# Patient Record
Sex: Female | Born: 1975 | Race: Black or African American | Hispanic: No | Marital: Single | Smoking: Never smoker
Health system: Southern US, Community
[De-identification: ages and names within clinical notes are randomized; demographics above are authoritative.]

---

## 2003-04-08 ENCOUNTER — Other Ambulatory Visit: Admission: RE | Admit: 2003-04-08 | Discharge: 2003-04-08 | Payer: Self-pay | Admitting: Family Medicine

## 2004-04-28 ENCOUNTER — Other Ambulatory Visit: Admission: RE | Admit: 2004-04-28 | Discharge: 2004-04-28 | Payer: Self-pay | Admitting: Family Medicine

## 2005-04-29 ENCOUNTER — Other Ambulatory Visit: Admission: RE | Admit: 2005-04-29 | Discharge: 2005-04-29 | Payer: Self-pay | Admitting: Family Medicine

## 2006-05-04 ENCOUNTER — Other Ambulatory Visit: Admission: RE | Admit: 2006-05-04 | Discharge: 2006-05-04 | Payer: Self-pay | Admitting: Obstetrics and Gynecology

## 2006-08-31 ENCOUNTER — Other Ambulatory Visit: Admission: RE | Admit: 2006-08-31 | Discharge: 2006-08-31 | Payer: Self-pay | Admitting: Obstetrics and Gynecology

## 2007-03-20 ENCOUNTER — Other Ambulatory Visit: Admission: RE | Admit: 2007-03-20 | Discharge: 2007-03-20 | Payer: Self-pay | Admitting: Obstetrics and Gynecology

## 2007-09-11 ENCOUNTER — Other Ambulatory Visit: Admission: RE | Admit: 2007-09-11 | Discharge: 2007-09-11 | Payer: Self-pay | Admitting: Obstetrics and Gynecology

## 2008-04-01 ENCOUNTER — Inpatient Hospital Stay (HOSPITAL_COMMUNITY): Admission: AD | Admit: 2008-04-01 | Discharge: 2008-04-04 | Payer: Self-pay | Admitting: Obstetrics and Gynecology

## 2008-09-25 ENCOUNTER — Other Ambulatory Visit: Admission: RE | Admit: 2008-09-25 | Discharge: 2008-09-25 | Payer: Self-pay | Admitting: Obstetrics and Gynecology

## 2009-10-01 ENCOUNTER — Other Ambulatory Visit: Admission: RE | Admit: 2009-10-01 | Discharge: 2009-10-01 | Payer: Self-pay | Admitting: Obstetrics and Gynecology

## 2010-03-29 ENCOUNTER — Encounter: Payer: Self-pay | Admitting: Obstetrics and Gynecology

## 2010-06-21 LAB — DIFFERENTIAL
Basophils Absolute: 0.1 10*3/uL (ref 0.0–0.1)
Basophils Relative: 1 % (ref 0–1)
Eosinophils Absolute: 0.1 10*3/uL (ref 0.0–0.7)
Eosinophils Relative: 1 % (ref 0–5)
Neutrophils Relative %: 66 % (ref 43–77)

## 2010-06-21 LAB — CBC
HCT: 24.4 % — ABNORMAL LOW (ref 36.0–46.0)
HCT: 35.9 % — ABNORMAL LOW (ref 36.0–46.0)
Hemoglobin: 11.3 g/dL — ABNORMAL LOW (ref 12.0–15.0)
Hemoglobin: 7.9 g/dL — CL (ref 12.0–15.0)
MCHC: 32.1 g/dL (ref 30.0–36.0)
MCHC: 32.3 g/dL (ref 30.0–36.0)
MCV: 79.5 fL (ref 78.0–100.0)
MCV: 79.7 fL (ref 78.0–100.0)
Platelets: 142 10*3/uL — ABNORMAL LOW (ref 150–400)
RBC: 3.06 MIL/uL — ABNORMAL LOW (ref 3.87–5.11)
RBC: 4.51 MIL/uL (ref 3.87–5.11)
RDW: 14.9 % (ref 11.5–15.5)
RDW: 14.9 % (ref 11.5–15.5)
WBC: 14.1 10*3/uL — ABNORMAL HIGH (ref 4.0–10.5)

## 2010-10-21 ENCOUNTER — Other Ambulatory Visit (HOSPITAL_COMMUNITY)
Admission: RE | Admit: 2010-10-21 | Discharge: 2010-10-21 | Disposition: A | Discharge status: Home / Self Care | Payer: PRIVATE HEALTH INSURANCE | Source: Ambulatory Visit | Attending: Obstetrics and Gynecology | Admitting: Obstetrics and Gynecology

## 2010-10-21 ENCOUNTER — Other Ambulatory Visit: Payer: Self-pay | Admitting: Nurse Practitioner

## 2010-10-21 DIAGNOSIS — Z01419 Encounter for gynecological examination (general) (routine) without abnormal findings: Secondary | ICD-10-CM | POA: Insufficient documentation

## 2011-11-01 ENCOUNTER — Other Ambulatory Visit: Payer: Self-pay | Admitting: Nurse Practitioner

## 2011-11-01 ENCOUNTER — Other Ambulatory Visit (HOSPITAL_COMMUNITY)
Admission: RE | Admit: 2011-11-01 | Discharge: 2011-11-01 | Disposition: A | Discharge status: Home / Self Care | Payer: BC Managed Care – PPO | Source: Ambulatory Visit | Attending: Obstetrics and Gynecology | Admitting: Obstetrics and Gynecology

## 2011-11-01 DIAGNOSIS — Z01419 Encounter for gynecological examination (general) (routine) without abnormal findings: Secondary | ICD-10-CM | POA: Insufficient documentation

## 2014-04-29 ENCOUNTER — Other Ambulatory Visit: Payer: Self-pay

## 2014-04-29 DIAGNOSIS — Z1231 Encounter for screening mammogram for malignant neoplasm of breast: Secondary | ICD-10-CM

## 2014-05-12 ENCOUNTER — Ambulatory Visit
Admission: RE | Admit: 2014-05-12 | Discharge: 2014-05-12 | Disposition: A | Discharge status: Home / Self Care | Payer: BLUE CROSS/BLUE SHIELD | Source: Ambulatory Visit

## 2014-05-12 DIAGNOSIS — Z1231 Encounter for screening mammogram for malignant neoplasm of breast: Secondary | ICD-10-CM

## 2014-11-11 ENCOUNTER — Other Ambulatory Visit (HOSPITAL_COMMUNITY)
Admission: RE | Admit: 2014-11-11 | Discharge: 2014-11-11 | Disposition: A | Discharge status: Home / Self Care | Payer: BLUE CROSS/BLUE SHIELD | Source: Ambulatory Visit | Attending: Nurse Practitioner | Admitting: Nurse Practitioner

## 2014-11-11 ENCOUNTER — Other Ambulatory Visit: Payer: Self-pay | Admitting: Nurse Practitioner

## 2014-11-11 DIAGNOSIS — Z113 Encounter for screening for infections with a predominantly sexual mode of transmission: Secondary | ICD-10-CM | POA: Insufficient documentation

## 2014-11-11 DIAGNOSIS — Z1151 Encounter for screening for human papillomavirus (HPV): Secondary | ICD-10-CM | POA: Insufficient documentation

## 2014-11-11 DIAGNOSIS — Z01419 Encounter for gynecological examination (general) (routine) without abnormal findings: Secondary | ICD-10-CM | POA: Diagnosis present

## 2014-11-13 LAB — CYTOLOGY - PAP

## 2015-04-21 ENCOUNTER — Other Ambulatory Visit: Payer: Self-pay

## 2015-04-21 DIAGNOSIS — Z1231 Encounter for screening mammogram for malignant neoplasm of breast: Secondary | ICD-10-CM

## 2015-05-18 ENCOUNTER — Ambulatory Visit
Admission: RE | Admit: 2015-05-18 | Discharge: 2015-05-18 | Disposition: A | Discharge status: Home / Self Care | Payer: BLUE CROSS/BLUE SHIELD | Source: Ambulatory Visit

## 2015-05-18 DIAGNOSIS — Z1231 Encounter for screening mammogram for malignant neoplasm of breast: Secondary | ICD-10-CM

## 2016-04-22 ENCOUNTER — Other Ambulatory Visit: Payer: Self-pay | Admitting: Nurse Practitioner

## 2016-04-22 DIAGNOSIS — Z1231 Encounter for screening mammogram for malignant neoplasm of breast: Secondary | ICD-10-CM

## 2016-05-23 ENCOUNTER — Ambulatory Visit
Admission: RE | Admit: 2016-05-23 | Discharge: 2016-05-23 | Disposition: A | Discharge status: Home / Self Care | Payer: No Typology Code available for payment source | Source: Ambulatory Visit | Attending: Nurse Practitioner | Admitting: Nurse Practitioner

## 2016-05-23 DIAGNOSIS — Z1231 Encounter for screening mammogram for malignant neoplasm of breast: Secondary | ICD-10-CM

## 2016-05-24 ENCOUNTER — Other Ambulatory Visit: Payer: Self-pay | Admitting: Nurse Practitioner

## 2016-05-24 DIAGNOSIS — R928 Other abnormal and inconclusive findings on diagnostic imaging of breast: Secondary | ICD-10-CM

## 2016-05-27 ENCOUNTER — Ambulatory Visit
Admission: RE | Admit: 2016-05-27 | Discharge: 2016-05-27 | Disposition: A | Discharge status: Home / Self Care | Payer: No Typology Code available for payment source | Source: Ambulatory Visit | Attending: Nurse Practitioner | Admitting: Nurse Practitioner

## 2016-05-27 DIAGNOSIS — R928 Other abnormal and inconclusive findings on diagnostic imaging of breast: Secondary | ICD-10-CM

## 2017-04-08 ENCOUNTER — Other Ambulatory Visit: Payer: Self-pay

## 2017-04-08 ENCOUNTER — Encounter (HOSPITAL_BASED_OUTPATIENT_CLINIC_OR_DEPARTMENT_OTHER): Payer: Self-pay | Admitting: Emergency Medicine

## 2017-04-08 ENCOUNTER — Emergency Department (HOSPITAL_BASED_OUTPATIENT_CLINIC_OR_DEPARTMENT_OTHER)
Admission: EM | Admit: 2017-04-08 | Discharge: 2017-04-08 | Disposition: A | Discharge status: Home / Self Care | Payer: No Typology Code available for payment source | Attending: Emergency Medicine | Admitting: Emergency Medicine

## 2017-04-08 DIAGNOSIS — R109 Unspecified abdominal pain: Secondary | ICD-10-CM | POA: Diagnosis present

## 2017-04-08 DIAGNOSIS — N946 Dysmenorrhea, unspecified: Secondary | ICD-10-CM | POA: Insufficient documentation

## 2017-04-08 LAB — URINALYSIS, ROUTINE W REFLEX MICROSCOPIC
BILIRUBIN URINE: NEGATIVE
Glucose, UA: NEGATIVE mg/dL
KETONES UR: 15 mg/dL — AB
NITRITE: NEGATIVE
PROTEIN: NEGATIVE mg/dL
Specific Gravity, Urine: >1.03 — ABNORMAL HIGH (ref 1.005–1.030)
pH: 6 (ref 5.0–8.0)

## 2017-04-08 LAB — WET PREP, GENITAL
Clue Cells Wet Prep HPF POC: NONE SEEN
Sperm: NONE SEEN
Trich, Wet Prep: NONE SEEN
Yeast Wet Prep HPF POC: NONE SEEN

## 2017-04-08 LAB — PREGNANCY, URINE: PREG TEST UR: NEGATIVE

## 2017-04-08 LAB — URINALYSIS, MICROSCOPIC (REFLEX)

## 2017-04-08 MED ORDER — IBUPROFEN 800 MG PO TABS: 800.0000 mg | ORAL_TABLET | Freq: Three times a day (TID) | ORAL | 0 refills | Status: AC | PRN

## 2017-04-08 MED ORDER — ONDANSETRON 8 MG PO TBDP
8.0000 mg | ORAL_TABLET | Freq: Once | ORAL | Status: AC
  Administered 2017-04-08: 8 mg via ORAL
  Filled 2017-04-08: qty 1

## 2017-04-08 NOTE — ED Triage Notes (Signed)
Pt presents with c/o lower abdominal pain and nausea. Pt took 600mg  motrin at 345.

## 2017-04-08 NOTE — ED Notes (Signed)
ED Provider at bedside. 

## 2017-04-08 NOTE — ED Provider Notes (Signed)
[facility] Provider Note: Lowella Dell, MD, FACEP  CSN: [id number] MRN: [id number] ARRIVAL: 04/08/17 at 0459 ROOM: MH10/MH10   CHIEF COMPLAINT  Abdominal Pain   HISTORY OF PRESENT ILLNESS  04/08/17 5:12 AM Brenda Booth is a 42 y.o. female recently changed her birth control pill scheduling to avoid having a period this week.  Despite this she had some vaginal spotting 3 days ago and the next morning had fairly significant vaginal bleeding which then subsided.  She has had no more bleeding but is now complaining of several days of cramping.  The cramping is located in the uterus and is like menstrual cramps but more severe.  She rates the pain as a 10 out of 10 at its worst.  She took 600 mg of ibuprofen yesterday afternoon and again about 90 minutes ago.  Her pain right now is mild.  She has had nausea but no vomiting.   History reviewed. No pertinent past medical history.  History reviewed. No pertinent surgical history.  No family history on file.  Social History   Tobacco Use  . Smoking status: Never Smoker  . Smokeless tobacco: Never Used  Substance Use Topics  . Alcohol use: No    Frequency: Never  . Drug use: No    Prior to Admission medications   Not on File    Allergies Patient has no known allergies.   REVIEW OF SYSTEMS  Negative except as noted here or in the History of Present Illness.   PHYSICAL EXAMINATION  Initial Vital Signs Blood pressure (!) 150/107, pulse 88, temperature 98.6 F (37 C), temperature source Oral, resp. rate 17, height 5' 5.5" (1.664 m), weight 77.1 kg (170 lb), last menstrual period 03/11/2017, SpO2 98 %.  Examination General: Well-developed, well-nourished female in no acute distress; appearance consistent with age of record HENT: normocephalic; atraumatic Eyes: pupils equal, round and reactive to light; extraocular muscles intact Neck: supple Heart: regular rate and rhythm Lungs: clear to auscultation  bilaterally Abdomen: soft; nondistended; nontender; no masses or hepatosplenomegaly; bowel sounds present GU: Normal external genitalia; small amount of bleeding and mucoid/purulent discharge per cervical loss; no cervical motion tenderness; mild left adnexal tenderness Extremities: No deformity; full range of motion; pulses normal Neurologic: Awake, alert and oriented; motor function intact in all extremities and symmetric; no facial droop Skin: Warm and dry Psychiatric: Normal mood and affect   RESULTS  Summary of this visit's results, reviewed by myself:   EKG Interpretation  Date/Time:    Ventricular Rate:    PR Interval:    QRS Duration:   QT Interval:    QTC Calculation:   R Axis:     Text Interpretation:        Laboratory Studies: Results for orders placed or performed during the hospital encounter of 04/08/17 (from the past 24 hour(s))  Urinalysis, Routine w reflex microscopic     Status: Abnormal   Collection Time: 04/08/17  5:12 AM  Result Value Ref Range   Color, Urine YELLOW YELLOW   APPearance CLOUDY (A) CLEAR   Specific Gravity, Urine >1.030 (H) 1.005 - 1.030   pH 6.0 5.0 - 8.0   Glucose, UA NEGATIVE NEGATIVE mg/dL   Hgb urine dipstick LARGE (A) NEGATIVE   Bilirubin Urine NEGATIVE NEGATIVE   Ketones, ur 15 (A) NEGATIVE mg/dL   Protein, ur NEGATIVE NEGATIVE mg/dL   Nitrite NEGATIVE NEGATIVE   Leukocytes, UA TRACE (A) NEGATIVE  Pregnancy, urine     Status: None  Collection Time: 04/08/17  5:12 AM  Result Value Ref Range   Preg Test, Ur NEGATIVE NEGATIVE  Urinalysis, Microscopic (reflex)     Status: Abnormal   Collection Time: 04/08/17  5:12 AM  Result Value Ref Range   RBC / HPF TOO NUMEROUS TO COUNT 0 - 5 RBC/hpf   WBC, UA 6-30 0 - 5 WBC/hpf   Bacteria, UA MANY (A) NONE SEEN   Squamous Epithelial / LPF 0-5 (A) NONE SEEN   Mucus PRESENT   Wet prep, genital     Status: Abnormal   Collection Time: 04/08/17  5:30 AM  Result Value Ref Range   Yeast Wet  Prep HPF POC NONE SEEN NONE SEEN   Trich, Wet Prep NONE SEEN NONE SEEN   Clue Cells Wet Prep HPF POC NONE SEEN NONE SEEN   WBC, Wet Prep HPF POC MANY (A) NONE SEEN   Sperm NONE SEEN    Imaging Studies: No results found.  ED COURSE  Nursing notes and initial vitals signs, including pulse oximetry, reviewed.  Vitals:   04/08/17 0505 04/08/17 0506  BP: (!) 150/107   Pulse: 88   Resp: 17   Temp: 98.6 F (37 C)   TempSrc: Oral   SpO2: 98%   Weight:  77.1 kg (170 lb)  Height:  5' 5.5" (1.664 m)   Patient's symptoms are likely dysmenorrhea associated with rescheduling her birth control.  I doubt PID or cervicitis as patient's exam was benign.  GC/Chlamydia swab has been sent.  PROCEDURES    ED DIAGNOSES     ICD-10-CM   1. Dysmenorrhea N94.6        Barrett Goldie, Jonny RuizJohn, MD 04/08/17 [id number]

## 2017-04-10 LAB — GC/CHLAMYDIA PROBE AMP (~~LOC~~) NOT AT ARMC
CHLAMYDIA, DNA PROBE: NEGATIVE
NEISSERIA GONORRHEA: NEGATIVE

## 2017-06-28 ENCOUNTER — Other Ambulatory Visit: Payer: Self-pay | Admitting: Nurse Practitioner

## 2017-06-28 DIAGNOSIS — Z1231 Encounter for screening mammogram for malignant neoplasm of breast: Secondary | ICD-10-CM

## 2017-07-24 ENCOUNTER — Ambulatory Visit
Admission: RE | Admit: 2017-07-24 | Discharge: 2017-07-24 | Disposition: A | Discharge status: Home / Self Care | Payer: PRIVATE HEALTH INSURANCE | Source: Ambulatory Visit | Attending: Nurse Practitioner | Admitting: Nurse Practitioner

## 2017-07-24 DIAGNOSIS — Z1231 Encounter for screening mammogram for malignant neoplasm of breast: Secondary | ICD-10-CM

## 2017-11-21 ENCOUNTER — Other Ambulatory Visit: Payer: Self-pay | Admitting: Nurse Practitioner

## 2017-11-21 ENCOUNTER — Other Ambulatory Visit (HOSPITAL_COMMUNITY)
Admission: RE | Admit: 2017-11-21 | Discharge: 2017-11-21 | Disposition: A | Discharge status: Home / Self Care | Payer: PRIVATE HEALTH INSURANCE | Source: Ambulatory Visit | Attending: Nurse Practitioner | Admitting: Nurse Practitioner

## 2017-11-21 DIAGNOSIS — Z01419 Encounter for gynecological examination (general) (routine) without abnormal findings: Secondary | ICD-10-CM | POA: Diagnosis present

## 2017-11-23 LAB — CYTOLOGY - PAP
Chlamydia: NEGATIVE
Diagnosis: NEGATIVE
HPV: NOT DETECTED
Neisseria Gonorrhea: NEGATIVE

## 2018-06-12 ENCOUNTER — Other Ambulatory Visit: Payer: Self-pay | Admitting: Nurse Practitioner

## 2018-06-12 DIAGNOSIS — N644 Mastodynia: Secondary | ICD-10-CM

## 2018-06-15 ENCOUNTER — Other Ambulatory Visit: Payer: No Typology Code available for payment source

## 2018-06-26 ENCOUNTER — Other Ambulatory Visit: Payer: Self-pay | Admitting: Nurse Practitioner

## 2018-06-26 DIAGNOSIS — Z1231 Encounter for screening mammogram for malignant neoplasm of breast: Secondary | ICD-10-CM

## 2018-07-06 ENCOUNTER — Other Ambulatory Visit: Payer: No Typology Code available for payment source

## 2018-09-03 ENCOUNTER — Ambulatory Visit
Admission: RE | Admit: 2018-09-03 | Discharge: 2018-09-03 | Disposition: A | Discharge status: Home / Self Care | Payer: PRIVATE HEALTH INSURANCE | Source: Ambulatory Visit | Attending: Nurse Practitioner | Admitting: Nurse Practitioner

## 2018-09-03 ENCOUNTER — Other Ambulatory Visit: Payer: Self-pay

## 2018-09-03 DIAGNOSIS — Z1231 Encounter for screening mammogram for malignant neoplasm of breast: Secondary | ICD-10-CM

## 2018-09-04 ENCOUNTER — Other Ambulatory Visit: Payer: Self-pay | Admitting: Nurse Practitioner

## 2018-09-04 DIAGNOSIS — R928 Other abnormal and inconclusive findings on diagnostic imaging of breast: Secondary | ICD-10-CM

## 2018-09-10 ENCOUNTER — Other Ambulatory Visit: Payer: PRIVATE HEALTH INSURANCE

## 2018-09-12 ENCOUNTER — Other Ambulatory Visit: Payer: Self-pay | Admitting: Nurse Practitioner

## 2018-09-12 ENCOUNTER — Ambulatory Visit
Admission: RE | Admit: 2018-09-12 | Discharge: 2018-09-12 | Disposition: A | Discharge status: Home / Self Care | Payer: PRIVATE HEALTH INSURANCE | Source: Ambulatory Visit | Attending: Nurse Practitioner | Admitting: Nurse Practitioner

## 2018-09-12 ENCOUNTER — Other Ambulatory Visit: Payer: Self-pay

## 2018-09-12 DIAGNOSIS — R928 Other abnormal and inconclusive findings on diagnostic imaging of breast: Secondary | ICD-10-CM

## 2018-09-12 DIAGNOSIS — N6320 Unspecified lump in the left breast, unspecified quadrant: Secondary | ICD-10-CM

## 2019-04-02 ENCOUNTER — Ambulatory Visit
Admission: RE | Admit: 2019-04-02 | Discharge: 2019-04-02 | Disposition: A | Discharge status: Home / Self Care | Payer: PRIVATE HEALTH INSURANCE | Source: Ambulatory Visit | Attending: Nurse Practitioner | Admitting: Nurse Practitioner

## 2019-04-02 ENCOUNTER — Other Ambulatory Visit: Payer: Self-pay | Admitting: Nurse Practitioner

## 2019-04-02 ENCOUNTER — Other Ambulatory Visit: Payer: Self-pay

## 2019-04-02 DIAGNOSIS — N6320 Unspecified lump in the left breast, unspecified quadrant: Secondary | ICD-10-CM

## 2019-09-22 IMAGING — MG DIGITAL SCREENING BILATERAL MAMMOGRAM WITH CAD
4 series · 4 of 4 positions shown · non-contrast
Comparison: Previous exam(s).

CLINICAL DATA: Screening.

EXAM:
DIGITAL SCREENING BILATERAL MAMMOGRAM WITH CAD

[R MLO]
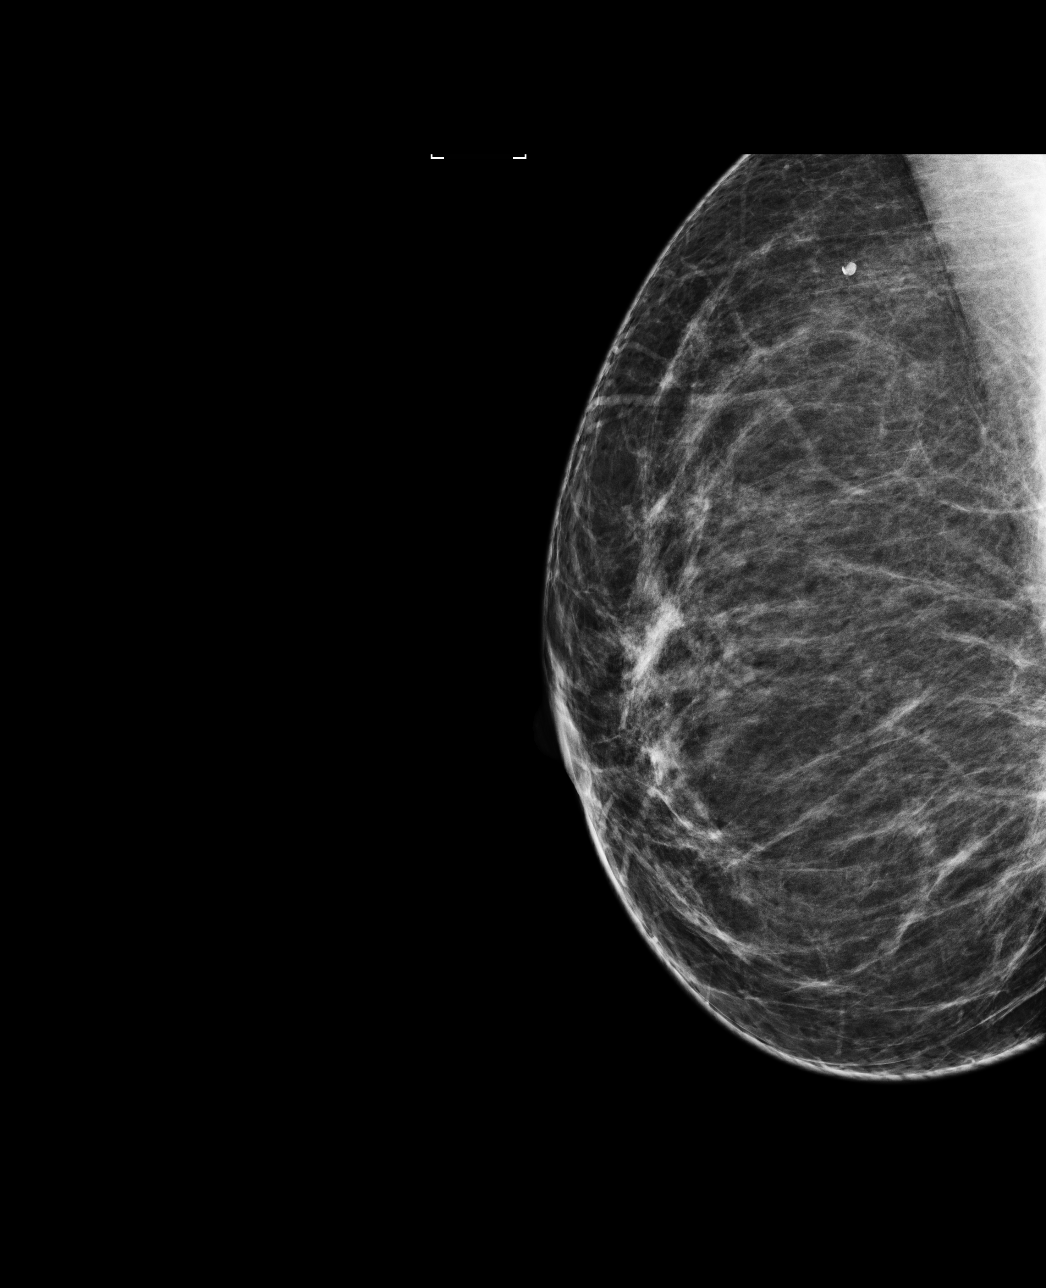

[R CC]
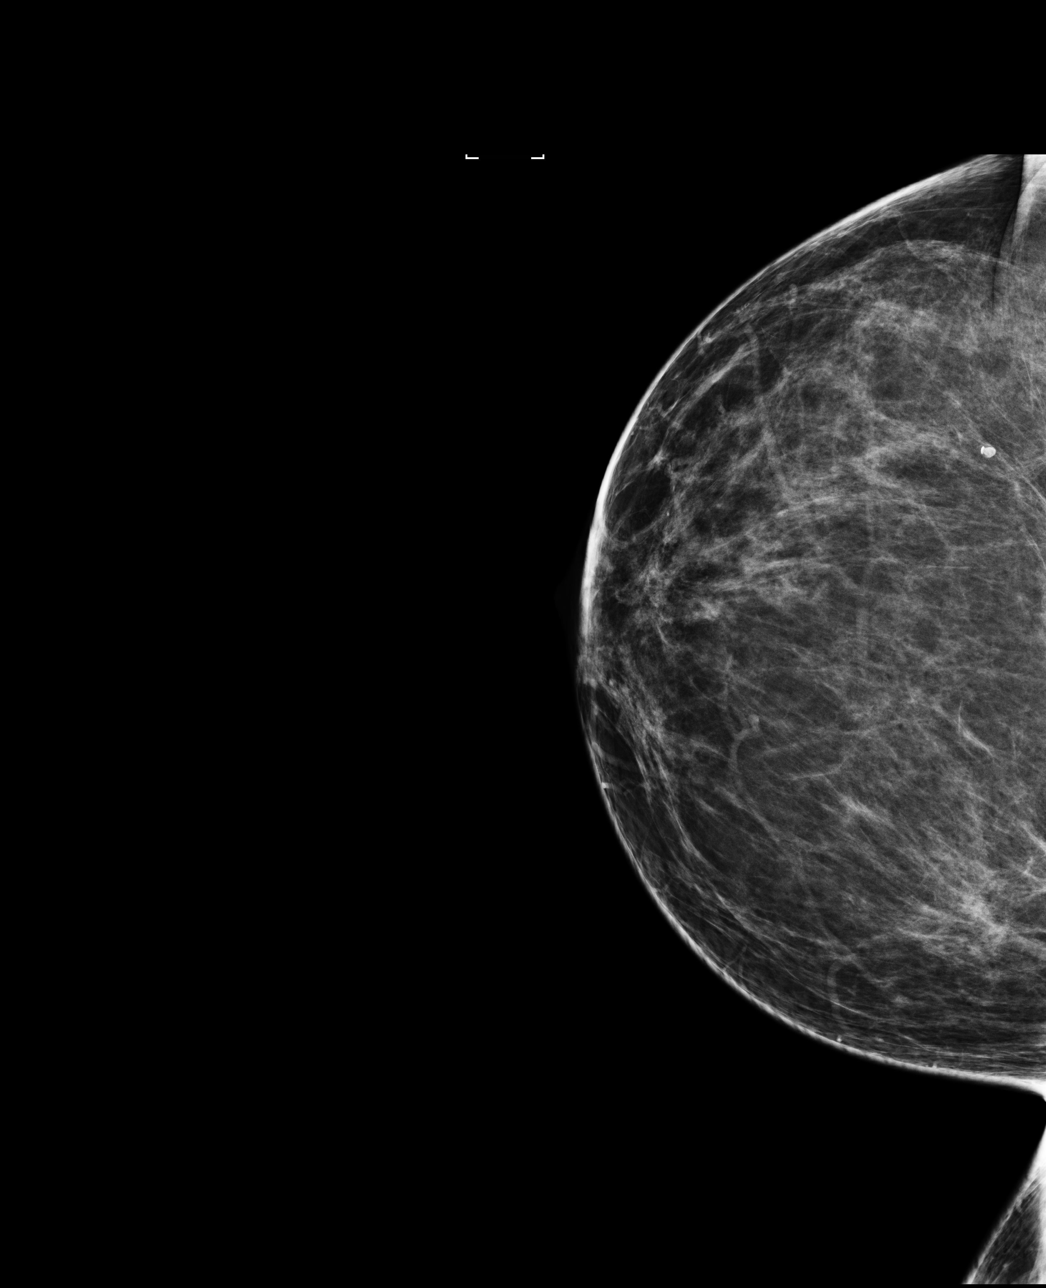

[L CC]
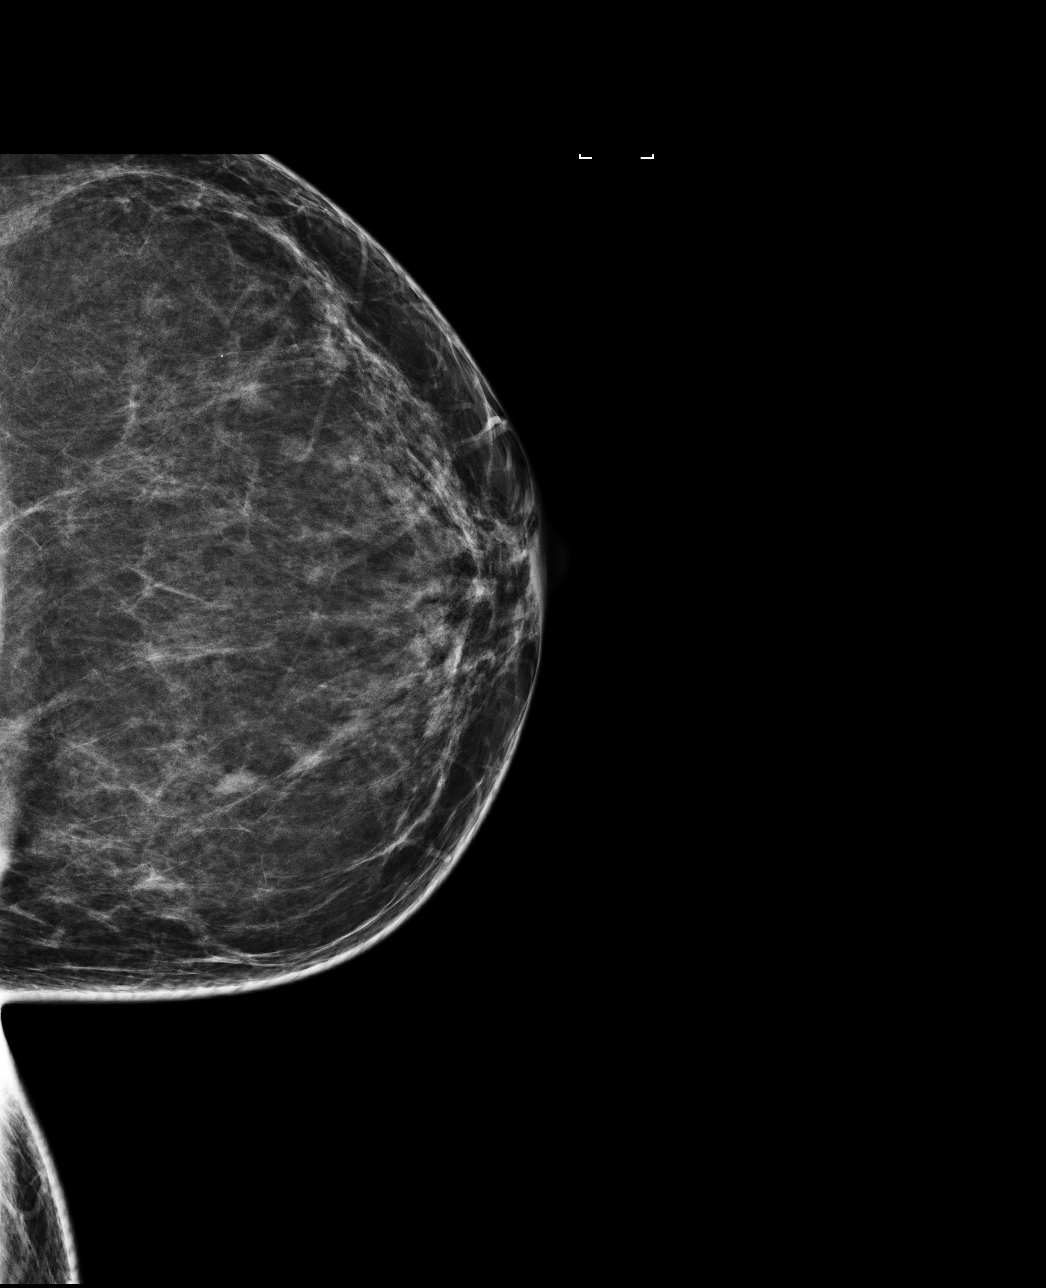

[L MLO]
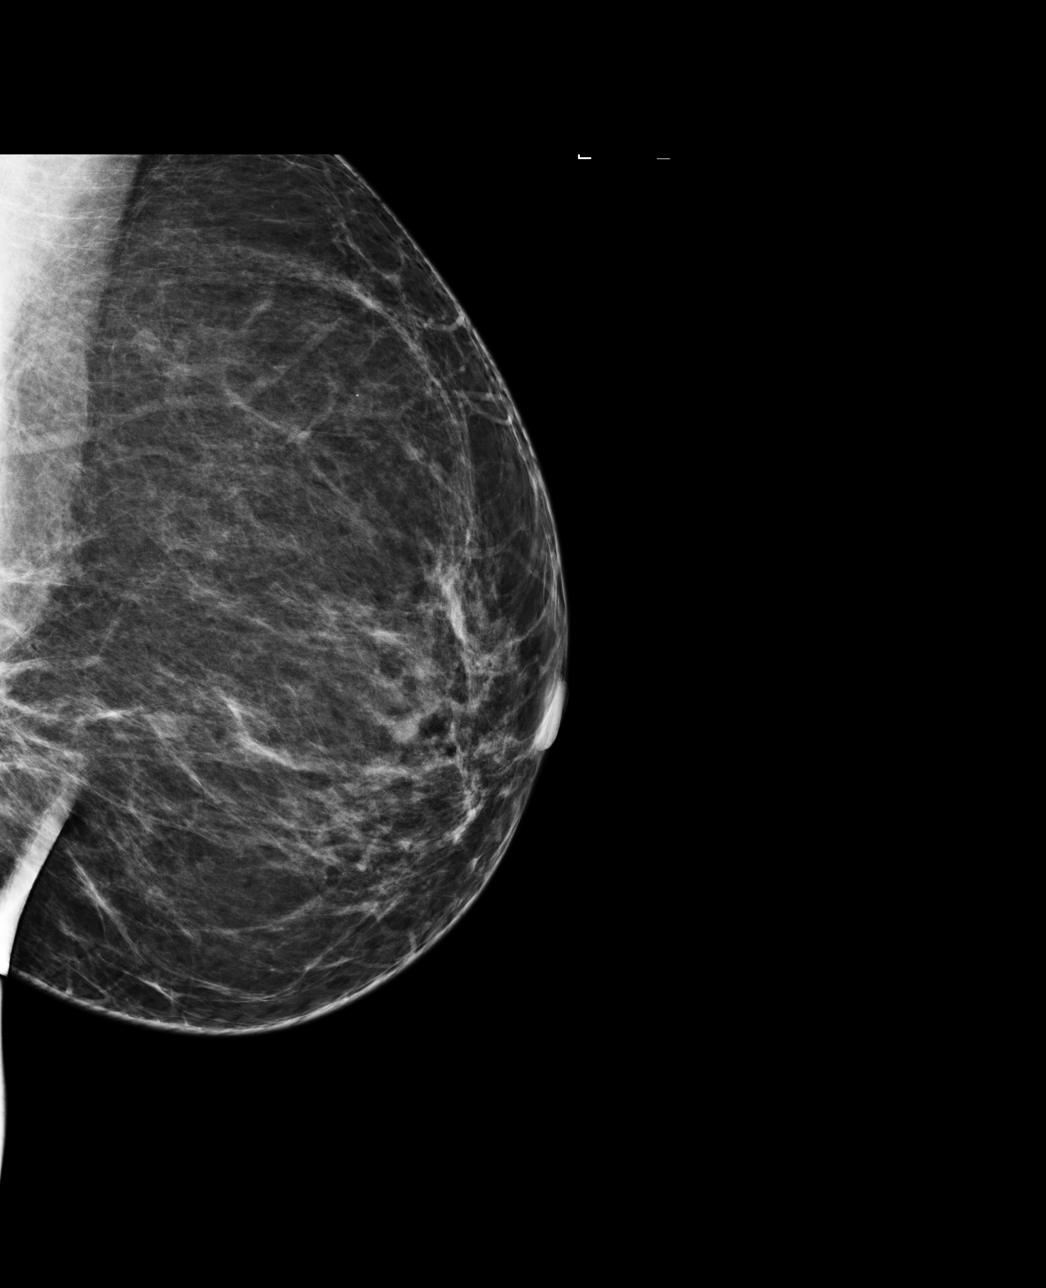

[4 of 4 positions shown; findings below may reference images not displayed]

ACR Breast Density Category b: There are scattered areas of
fibroglandular density.
FINDINGS: There are no findings suspicious for malignancy. Images were
processed with CAD.
IMPRESSION: No mammographic evidence of malignancy. A result letter of this
screening mammogram will be mailed directly to the patient.

RECOMMENDATION:
Screening mammogram in one year. (Code:AS-G-LCT)

BI-RADS CATEGORY  1: Negative.

## 2019-10-04 ENCOUNTER — Other Ambulatory Visit: Payer: Self-pay

## 2019-10-04 ENCOUNTER — Ambulatory Visit
Admission: RE | Admit: 2019-10-04 | Discharge: 2019-10-04 | Disposition: A | Discharge status: Home / Self Care | Payer: PRIVATE HEALTH INSURANCE | Source: Ambulatory Visit | Attending: Nurse Practitioner | Admitting: Nurse Practitioner

## 2019-10-04 DIAGNOSIS — N6320 Unspecified lump in the left breast, unspecified quadrant: Secondary | ICD-10-CM

## 2020-01-01 ENCOUNTER — Other Ambulatory Visit (HOSPITAL_COMMUNITY): Payer: Self-pay | Admitting: Internal Medicine

## 2020-01-01 ENCOUNTER — Ambulatory Visit: Payer: PRIVATE HEALTH INSURANCE | Attending: Internal Medicine

## 2020-01-01 DIAGNOSIS — Z23 Encounter for immunization: Secondary | ICD-10-CM

## 2020-01-01 NOTE — Progress Notes (Signed)
   Covid-19 Vaccination Clinic  Name:  Brenda Booth    MRN: [id number] DOB: March 23, 1975  01/01/2020  [name] was observed post Covid-19 immunization for 15 minutes without incident. She was provided with Vaccine Information Sheet and instruction to access the V-Safe system.   [name] was instructed to call 911 with any severe reactions post vaccine: Marland Kitchen Difficulty breathing  . Swelling of face and throat  . A fast heartbeat  . A bad rash all over body  . Dizziness and weakness

## 2021-03-03 ENCOUNTER — Other Ambulatory Visit: Payer: Self-pay | Admitting: Obstetrics and Gynecology

## 2021-03-03 DIAGNOSIS — N6489 Other specified disorders of breast: Secondary | ICD-10-CM

## 2021-03-15 ENCOUNTER — Other Ambulatory Visit (HOSPITAL_COMMUNITY): Payer: Self-pay

## 2021-03-18 ENCOUNTER — Ambulatory Visit
Admission: RE | Admit: 2021-03-18 | Discharge: 2021-03-18 | Disposition: A | Discharge status: Home / Self Care | Payer: PRIVATE HEALTH INSURANCE | Source: Ambulatory Visit | Attending: Obstetrics and Gynecology | Admitting: Obstetrics and Gynecology

## 2021-03-18 DIAGNOSIS — N6489 Other specified disorders of breast: Secondary | ICD-10-CM

## 2022-03-04 ENCOUNTER — Other Ambulatory Visit: Payer: Self-pay | Admitting: Obstetrics and Gynecology

## 2022-03-04 DIAGNOSIS — Z1231 Encounter for screening mammogram for malignant neoplasm of breast: Secondary | ICD-10-CM

## 2023-04-11 ENCOUNTER — Other Ambulatory Visit: Payer: Self-pay | Admitting: Obstetrics and Gynecology

## 2023-04-11 DIAGNOSIS — Z1231 Encounter for screening mammogram for malignant neoplasm of breast: Secondary | ICD-10-CM

## 2023-04-28 ENCOUNTER — Ambulatory Visit: Payer: PRIVATE HEALTH INSURANCE | Admitting: Family Medicine

## 2023-05-12 ENCOUNTER — Ambulatory Visit
Admission: RE | Admit: 2023-05-12 | Discharge: 2023-05-12 | Disposition: A | Discharge status: Home / Self Care | Payer: No Typology Code available for payment source | Source: Ambulatory Visit | Attending: Obstetrics and Gynecology | Admitting: Obstetrics and Gynecology

## 2023-05-12 DIAGNOSIS — Z1231 Encounter for screening mammogram for malignant neoplasm of breast: Secondary | ICD-10-CM | POA: Diagnosis not present

## 2023-06-27 ENCOUNTER — Other Ambulatory Visit (HOSPITAL_COMMUNITY)
Admission: RE | Admit: 2023-06-27 | Discharge: 2023-06-27 | Disposition: A | Discharge status: Home / Self Care | Source: Ambulatory Visit | Attending: Obstetrics and Gynecology | Admitting: Obstetrics and Gynecology

## 2023-06-27 DIAGNOSIS — Z13220 Encounter for screening for lipoid disorders: Secondary | ICD-10-CM | POA: Diagnosis not present

## 2023-06-27 DIAGNOSIS — Z1211 Encounter for screening for malignant neoplasm of colon: Secondary | ICD-10-CM | POA: Diagnosis not present

## 2023-06-27 DIAGNOSIS — Z01419 Encounter for gynecological examination (general) (routine) without abnormal findings: Secondary | ICD-10-CM | POA: Diagnosis not present

## 2023-06-27 DIAGNOSIS — Z862 Personal history of diseases of the blood and blood-forming organs and certain disorders involving the immune mechanism: Secondary | ICD-10-CM | POA: Diagnosis not present

## 2023-06-27 DIAGNOSIS — N912 Amenorrhea, unspecified: Secondary | ICD-10-CM | POA: Diagnosis not present

## 2023-06-27 DIAGNOSIS — R232 Flushing: Secondary | ICD-10-CM | POA: Diagnosis not present

## 2023-06-29 LAB — CYTOLOGY - PAP
Comment: NEGATIVE
Diagnosis: NEGATIVE
High risk HPV: NEGATIVE

## 2023-09-13 ENCOUNTER — Telehealth

## 2023-09-13 ENCOUNTER — Telehealth: Admitting: Physician Assistant

## 2023-09-13 DIAGNOSIS — J028 Acute pharyngitis due to other specified organisms: Secondary | ICD-10-CM | POA: Diagnosis not present

## 2023-09-13 DIAGNOSIS — B9689 Other specified bacterial agents as the cause of diseases classified elsewhere: Secondary | ICD-10-CM | POA: Diagnosis not present

## 2023-09-13 MED ORDER — AMOXICILLIN 500 MG PO CAPS: 500.0000 mg | ORAL_CAPSULE | Freq: Two times a day (BID) | ORAL | 0 refills | Status: AC

## 2023-09-13 NOTE — Progress Notes (Signed)
 Virtual Visit Consent   Brenda Booth, you are scheduled for a virtual visit with a [facility] provider today. Just as with appointments in the office, your consent must be obtained to participate. Your consent will be active for this visit and any virtual visit you may have with one of our providers in the next 365 days. If you have a MyChart account, a copy of this consent can be sent to you electronically.  As this is a virtual visit, video technology does not allow for your provider to perform a traditional examination. This may limit your provider's ability to fully assess your condition. If your provider identifies any concerns that need to be evaluated in person or the need to arrange testing (such as labs, EKG, etc.), we will make arrangements to do so. Although advances in technology are sophisticated, we cannot ensure that it will always work on either your end or our end. If the connection with a video visit is poor, the visit may have to be switched to a telephone visit. With either a video or telephone visit, we are not always able to ensure that we have a secure connection.  By engaging in this virtual visit, you consent to the provision of healthcare and authorize for your insurance to be billed (if applicable) for the services provided during this visit. Depending on your insurance coverage, you may receive a charge related to this service.  I need to obtain your verbal consent now. Are you willing to proceed with your visit today? Brenda Booth has provided verbal consent on 09/13/2023 for a virtual visit (video or telephone). Brenda CHRISTELLA Dickinson, PA-C  Date: 09/13/2023 10:03 AM   Virtual Visit via Video Note   I, Brenda Booth, connected with  Brenda Booth  ([id number], Jul 18, 1975) on 09/13/23 at  9:45 AM EDT by a video-enabled telemedicine application and verified that I am speaking with the correct person using two identifiers.  Location: Patient:  Virtual Visit Location Patient: Home Provider: Virtual Visit Location Provider: Home Office   I discussed the limitations of evaluation and management by telemedicine and the availability of in person appointments. The patient expressed understanding and agreed to proceed.    History of Present Illness: Brenda Booth is a 48 y.o. who identifies as a female who was assigned female at birth, and is being seen today for sore throat.  HPI: Sore Throat  This is a new problem. The current episode started 1 to 4 weeks ago (3 weeks). The problem has been gradually worsening (started waking her up in the middle of the night). There has been no fever. The pain is moderate. Associated symptoms include congestion, coughing (once), headaches (mild) and trouble swallowing. Pertinent negatives include no diarrhea, ear discharge, ear pain, hoarse voice, plugged ear sensation, neck pain, shortness of breath, swollen glands or vomiting. She has tried gargles and NSAIDs (allegra, alka seltzer cold and flu, sudafed cold) for the symptoms. The treatment provided no relief (gargles helped some).     Problems: There are no active problems to display for this patient.   Allergies: No Known Allergies Medications:  Current Outpatient Medications:    amoxicillin  (AMOXIL ) 500 MG capsule, Take 1 capsule (500 mg total) by mouth 2 (two) times daily for 10 days., Disp: 20 capsule, Rfl: 0   ibuprofen  (ADVIL ,MOTRIN ) 800 MG tablet, Take 1 tablet (800 mg total) by mouth every 8 (eight) hours as needed for cramping., Disp: 21 tablet, Rfl: 0  Observations/Objective:  Patient is well-developed, well-nourished in no acute distress.  Resting comfortably at home.  Head is normocephalic, atraumatic.  No labored breathing.  Speech is clear and coherent with logical content.  Patient is alert and oriented at baseline.    Assessment and Plan: 1. Acute bacterial pharyngitis (Primary) - amoxicillin  (AMOXIL ) 500 MG capsule;  Take 1 capsule (500 mg total) by mouth 2 (two) times daily for 10 days.  Dispense: 20 capsule; Refill: 0  - Suspect bacterial pharyngitis - Amoxicillin  prescribed - Tylenol and Ibuprofen  alternating every 4 hours - Salt water gargles - Chloraseptic spray - Liquid and soft food diet - Push fluids - New toothbrush in 3 days - Seek in person evaluation if not improving or if symptoms worsen   Follow Up Instructions: I discussed the assessment and treatment plan with the patient. The patient was provided an opportunity to ask questions and all were answered. The patient agreed with the plan and demonstrated an understanding of the instructions.  A copy of instructions were sent to the patient via MyChart unless otherwise noted below.    The patient was advised to call back or seek an in-person evaluation if the symptoms worsen or if the condition fails to improve as anticipated.    Brenda CHRISTELLA Dickinson, PA-C

## 2023-09-13 NOTE — Patient Instructions (Signed)
 Brenda Booth, thank you for joining Delon CHRISTELLA Dickinson, PA-C for today's virtual visit.  While this provider is not your primary care provider (PCP), if your PCP is located in our provider database this encounter information will be shared with them immediately following your visit.   A [facility] MyChart account gives you access to today's visit and all your visits, tests, and labs performed at [facility]  click here if you don't have a [facility] MyChart account or go to [email or web address]  Consent: (Patient) Brenda Booth provided verbal consent for this virtual visit at the beginning of the encounter.  Current Medications:  Current Outpatient Medications:    amoxicillin  (AMOXIL ) 500 MG capsule, Take 1 capsule (500 mg total) by mouth 2 (two) times daily for 10 days., Disp: 20 capsule, Rfl: 0   ibuprofen  (ADVIL ,MOTRIN ) 800 MG tablet, Take 1 tablet (800 mg total) by mouth every 8 (eight) hours as needed for cramping., Disp: 21 tablet, Rfl: 0   Medications ordered in this encounter:  Meds ordered this encounter  Medications   amoxicillin  (AMOXIL ) 500 MG capsule    Sig: Take 1 capsule (500 mg total) by mouth 2 (two) times daily for 10 days.    Dispense:  20 capsule    Refill:  0    Supervising Provider:   BLAISE ALEENE KIDD [[id number]]     *If you need refills on other medications prior to your next appointment, please contact your pharmacy*  Follow-Up: Call back or seek an in-person evaluation if the symptoms worsen or if the condition fails to improve as anticipated.  [facility] [phone]  Other Instructions Pharyngitis  Pharyngitis is inflammation of the throat (pharynx). It is a very common cause of sore throat. Pharyngitis can be caused by a bacteria, but it is usually caused by a virus. Most cases of pharyngitis get better on their own without treatment. What are the causes? This condition may be caused by: Infection  by viruses (viral). Viral pharyngitis spreads easily from person to person (is contagious) through coughing, sneezing, and sharing of personal items or utensils such as cups, forks, spoons, and toothbrushes. Infection by bacteria (bacterial). Bacterial pharyngitis may be spread by touching the nose or face after coming in contact with the bacteria, or through close contact, such as kissing. Allergies. Allergies can cause buildup of mucus in the throat (post-nasal drip), leading to inflammation and irritation. Allergies can also cause blocked nasal passages, forcing breathing through the mouth, which dries and irritates the throat. What increases the risk? You are more likely to develop this condition if: You are 63-53 years old. You are exposed to crowded environments such as daycare, school, or dormitory living. You live in a cold climate. You have a weakened disease-fighting (immune) system. What are the signs or symptoms? Symptoms of this condition vary by the cause. Common symptoms of this condition include: Sore throat. Fatigue. Low-grade fever. Stuffy nose (nasal congestion) and cough. Headache. Other symptoms may include: Glands in the neck (lymph nodes) that are swollen. Skin rashes. Plaque-like film on the throat or tonsils. This is often a symptom of bacterial pharyngitis. Vomiting. Red, itchy eyes (conjunctivitis). Loss of appetite. Joint pain and muscle aches. Enlarged tonsils. How is this diagnosed? This condition may be diagnosed based on your medical history and a physical exam. Your health care provider will ask you questions about your illness and your symptoms. A swab of your throat may be done to check for bacteria (  rapid strep test). Other lab tests may also be done, depending on the suspected cause, but these are rare. How is this treated? Many times, treatment is not needed for this condition. Pharyngitis usually gets better in 3-4 days without treatment. Bacterial  pharyngitis may be treated with antibiotic medicines. Follow these instructions at home: Medicines Take over-the-counter and prescription medicines only as told by your health care provider. If you were prescribed an antibiotic medicine, take it as told by your health care provider. Do not stop taking the antibiotic even if you start to feel better. Use throat sprays to soothe your throat as told by your health care provider. Children can get pharyngitis. Do not give your child aspirin because of the association with Reye's syndrome. Managing pain To help with pain, try: Sipping warm liquids, such as broth, herbal tea, or warm water. Eating or drinking cold or frozen liquids, such as frozen ice pops. Gargling with a mixture of salt and water 3-4 times a day or as needed. To make salt water, completely dissolve -1 tsp (3-6 g) of salt in 1 cup (237 mL) of warm water. Sucking on hard candy or throat lozenges. Putting a cool-mist humidifier in your bedroom at night to moisten the air. Sitting in the bathroom with the door closed for 5-10 minutes while you run hot water in the shower.  General instructions  Do not use any products that contain nicotine or tobacco. These products include cigarettes, chewing tobacco, and vaping devices, such as e-cigarettes. If you need help quitting, ask your health care provider. Rest as told by your health care provider. Drink enough fluid to keep your urine pale yellow. How is this prevented? To help prevent becoming infected or spreading infection: Wash your hands often with soap and water for at least 20 seconds. If soap and water are not available, use hand sanitizer. Do not touch your eyes, nose, or mouth with unwashed hands, and wash hands after touching these areas. Do not share cups or eating utensils. Avoid close contact with people who are sick. Contact a health care provider if: You have large, tender lumps in your neck. You have a rash. You  cough up green, yellow-brown, or bloody mucus. Get help right away if: Your neck becomes stiff. You drool or are unable to swallow liquids. You cannot drink or take medicines without vomiting. You have severe pain that does not go away, even after you take medicine. You have trouble breathing, and it is not caused by a stuffy nose. You have new pain and swelling in your joints such as the knees, ankles, wrists, or elbows. These symptoms may represent a serious problem that is an emergency. Do not wait to see if the symptoms will go away. Get medical help right away. Call your local emergency services (911 in the [place] Do not drive yourself to the hospital. Summary Pharyngitis is redness, pain, and swelling (inflammation) of the throat (pharynx). While pharyngitis can be caused by a bacteria, the most common causes are viral. Most cases of pharyngitis get better on their own without treatment. Bacterial pharyngitis is treated with antibiotic medicines. This information is not intended to replace advice given to you by your health care provider. Make sure you discuss any questions you have with your health care provider. Document Revised: 05/20/2020 Document Reviewed: 05/20/2020 Elsevier Patient Education  2024 [employer].   If you have been instructed to have an in-person evaluation today at a local Urgent Care facility, please use  the link below. It will take you to a list of all of our available [facility], including address, phone number and hours of operation. Please do not delay care.  [facility]  If you or a family member do not have a primary care provider, use the link below to schedule a visit and establish care. When you choose a [facility] primary care physician or advanced practice provider, you gain a long-term partner in health. Find a Primary Care Provider  Learn more about [facility]'s in-office and virtual care options: [facility] - Get  Care Now

## 2023-10-01 ENCOUNTER — Other Ambulatory Visit: Payer: Self-pay

## 2023-10-01 ENCOUNTER — Emergency Department (HOSPITAL_BASED_OUTPATIENT_CLINIC_OR_DEPARTMENT_OTHER): Admission: EM | Admit: 2023-10-01 | Discharge: 2023-10-01 | Disposition: A | Discharge status: Home / Self Care

## 2023-10-01 DIAGNOSIS — R000 Tachycardia, unspecified: Secondary | ICD-10-CM | POA: Diagnosis not present

## 2023-10-01 DIAGNOSIS — T7840XA Allergy, unspecified, initial encounter: Secondary | ICD-10-CM | POA: Diagnosis not present

## 2023-10-01 DIAGNOSIS — R609 Edema, unspecified: Secondary | ICD-10-CM | POA: Diagnosis not present

## 2023-10-01 DIAGNOSIS — T782XXA Anaphylactic shock, unspecified, initial encounter: Secondary | ICD-10-CM | POA: Diagnosis not present

## 2023-10-01 DIAGNOSIS — I10 Essential (primary) hypertension: Secondary | ICD-10-CM | POA: Diagnosis not present

## 2023-10-01 DIAGNOSIS — K148 Other diseases of tongue: Secondary | ICD-10-CM | POA: Diagnosis not present

## 2023-10-01 DIAGNOSIS — R220 Localized swelling, mass and lump, head: Secondary | ICD-10-CM | POA: Diagnosis not present

## 2023-10-01 DIAGNOSIS — L299 Pruritus, unspecified: Secondary | ICD-10-CM | POA: Diagnosis not present

## 2023-10-01 MED ORDER — PREDNISONE 20 MG PO TABS: 40.0000 mg | ORAL_TABLET | Freq: Every day | ORAL | 0 refills | Status: DC

## 2023-10-01 MED ORDER — DIPHENHYDRAMINE HCL 50 MG/ML IJ SOLN
25.0000 mg | Freq: Once | INTRAMUSCULAR | Status: AC
  Administered 2023-10-01: 25 mg via INTRAVENOUS
  Filled 2023-10-01: qty 1

## 2023-10-01 MED ORDER — DEXAMETHASONE SODIUM PHOSPHATE 10 MG/ML IJ SOLN
10.0000 mg | Freq: Once | INTRAMUSCULAR | Status: AC
  Administered 2023-10-01: 10 mg via INTRAVENOUS
  Filled 2023-10-01: qty 1

## 2023-10-01 MED ORDER — SODIUM CHLORIDE 0.9 % IV BOLUS
1000.0000 mL | Freq: Once | INTRAVENOUS | Status: AC
  Administered 2023-10-01: 1000 mL via INTRAVENOUS

## 2023-10-01 MED ORDER — FAMOTIDINE IN NACL 20-0.9 MG/50ML-% IV SOLN
20.0000 mg | Freq: Once | INTRAVENOUS | Status: AC
  Administered 2023-10-01: 20 mg via INTRAVENOUS
  Filled 2023-10-01: qty 50

## 2023-10-01 MED ORDER — EPINEPHRINE 0.3 MG/0.3ML IJ SOAJ: 0.3000 mg | INTRAMUSCULAR | 0 refills | Status: AC | PRN

## 2023-10-01 NOTE — ED Notes (Signed)
 BIB EMS,complaining of itchiness/hot flushes after mowing grass,also taken 2 tablespoon of sea moss sauce then felt numbness/swollen around the mouth. Went to [facility]-Benadryl  50mg ,Epipen  given.

## 2023-10-01 NOTE — Discharge Instructions (Addendum)
 It was a pleasure taking part in your care.  As discussed, I am discharging you with an EpiPen  as well as a referral to the allergy center.  Please follow-up with the allergy center and make an appointment to be seen.  Please begin taking steroids, 40 mg, once a day beginning on 7/28.  You will take this for 3 days.  Please return to the ED with any new or worsening symptoms.  Follow-up with PCP in the meantime.  Please discontinue use of sea moss.

## 2023-10-01 NOTE — ED Provider Notes (Signed)
 [place] EMERGENCY DEPARTMENT AT [facility] Provider Note   CSN: [id number] Arrival date & time: 10/01/23  1311     Patient presents with: Allergic Reaction   Brenda Booth is a 48 y.o. female with no medical history of the patient presents to the ED for evaluation of allergic reaction.  States that she was mowing her grass earlier today when she was bit by multiple ants on her ankles.  States that her ankles became very itchy and she became very hot.  Reports she stopped mowing the grass at this time and went inside for a shower.  States in the shower she continued to sweat so she decided to take a cold shower.  States she got out of the shower and had some vague itchiness throughout her entire body.  Went downstairs to eat sea moss and then began to have facial swelling.  She states that she then took her son to work and was on her way to [facility] for Benadryl  when she noticed that her face and tongue were swelling and she went to [place] walk-in clinic for evaluation.  At [facility] walk-in clinic, she was given epinephrine  and Benadryl  and then sent via EMS to the ED.  She states that this is the second time that she ate sea moss.  She reports when she initially ate it she had no reaction.  She states that she is taking this for health.   Allergic Reaction      Prior to Admission medications   Medication Sig Start Date End Date Taking? Authorizing Provider  EPINEPHrine  0.3 mg/0.3 mL IJ SOAJ injection Inject 0.3 mg into the muscle as needed for anaphylaxis. 10/01/23  Yes Ruthell Lonni FALCON, PA-C  predniSONE  (DELTASONE ) 20 MG tablet Take 2 tablets (40 mg total) by mouth daily. 10/01/23  Yes Ruthell Lonni FALCON, PA-C  ibuprofen  (ADVIL ,MOTRIN ) 800 MG tablet Take 1 tablet (800 mg total) by mouth every 8 (eight) hours as needed for cramping. 04/08/17   Molpus, Norleen, MD    Allergies: Patient has no known allergies.    Review of Systems  HENT:  Positive for facial swelling.    All other systems reviewed and are negative.   Updated Vital Signs BP (!) 147/89   Pulse (!) 104   Temp 98.5 F (36.9 C) (Oral)   Resp 20   Ht 5' 5 (1.651 m)   Wt 85.3 kg   LMP 09/10/2023 (Approximate)   SpO2 100%   BMI 31.28 kg/m   Physical Exam Vitals and nursing note reviewed.  Constitutional:      General: She is not in acute distress.    Appearance: She is well-developed.  HENT:     Head: Normocephalic and atraumatic.     Mouth/Throat:     Mouth: Mucous membranes are moist.     Pharynx: Oropharynx is clear.     Comments: Uvula midline.  Handling secretions appropriately.  No drooling or change phonation.  Angioedema present. Eyes:     Conjunctiva/sclera: Conjunctivae normal.  Cardiovascular:     Rate and Rhythm: Normal rate and regular rhythm.     Heart sounds: No murmur heard. Pulmonary:     Effort: Pulmonary effort is normal. No respiratory distress.     Breath sounds: Normal breath sounds.  Abdominal:     Palpations: Abdomen is soft.     Tenderness: There is no abdominal tenderness.  Musculoskeletal:        General: No swelling.     Cervical  back: Neck supple.  Skin:    General: Skin is warm and dry.     Capillary Refill: Capillary refill takes less than 2 seconds.  Neurological:     Mental Status: She is alert and oriented to person, place, and time. Mental status is at baseline.  Psychiatric:        Mood and Affect: Mood normal.     (all labs ordered are listed, but only abnormal results are displayed) Labs Reviewed - No data to display  EKG: EKG Interpretation Date/Time:  Sunday October 01 2023 13:20:27 EDT Ventricular Rate:  113 PR Interval:  125 QRS Duration:  84 QT Interval:  329 QTC Calculation: 452 R Axis:   -51  Text Interpretation: Sinus tachycardia Probable left atrial enlargement Left anterior fascicular block Low voltage, precordial leads Confirmed by Lemly, Tatum ([id number]) on 10/01/2023 1:51:10 PM  Radiology: No results  found.   Procedures   Medications Ordered in the ED  sodium chloride 0.9 % bolus 1,000 mL ( Intravenous Stopped 10/01/23 1514)  famotidine (PEPCID) IVPB 20 mg premix (0 mg Intravenous Stopped 10/01/23 1447)  diphenhydrAMINE (BENADRYL) injection 25 mg (25 mg Intravenous Given 10/01/23 1412)  dexamethasone (DECADRON) injection 10 mg (10 mg Intravenous Given 10/01/23 1412)    Medical Decision Making Risk Prescription drug management.   48 year old female presents for evaluation of suspected allergic reaction.  On examination, the patient does have angioedema to her bottom and top lip.  Her uvula is midline, she is handling secretions appropriately, she is not drooling, there is no change in phonation.  She has slight facial swelling.  She has no evidence of hives.  She is tachycardic, afebrile.  Her lung sounds are clear bilaterally and she is not hypoxic.  She was seen at urgent care and given Benadryl and epinephrine and then EMS was called for transport.  Patient arrives stating that she has had some resolution of swelling.  She denies history of anaphylaxis or being seen by allergist in the past.  Patient given famotidine, Benadryl, steroids and fluid here.  On reassessment, after being observed for 3 hours, she reports that her symptoms of almost entirely resolved.  She is still slightly tachycardic but reports that she feels nervous and this is most likely attributing to this, she also had epinephrine.  She denies any chest pain or shortness of breath.  She reports feeling back to her baseline.  She is requesting discharge.  I will discharge patient home with EpiPen, steroid burst as well as referral to allergy center.  Unsure as to what caused her allergic reaction today.  Have requested that she stop consuming sea moss. She was advised to follow-up in the meantime with allergy center.  Encouraged to return to the ED with any new or worsening symptoms.  She is stable to discharge home.   Final  diagnoses:  Allergic reaction, initial encounter    ED Discharge Orders          Ordered    EPINEPHrine 0.3 mg/0.3 mL IJ SOAJ injection  As needed        10/01/23 1451    predniSONE (DELTASONE) 20 MG tablet  Daily        07 /27/25 1620               Ruthell Lonni FALCON, PA-C 10/01/23 1623    Simon Lavonia SAILOR, MD 10/02/23 [phone]

## 2023-10-29 NOTE — Progress Notes (Unsigned)
 New Patient Note  RE: Brenda Booth MRN: [id number] DOB: 06-01-1975 Date of Office Visit: 10/30/2023  Consult requested by: Thongteum, Derald SAILOR, NP Primary care provider: Thongteum, Derald SAILOR, NP  Chief Complaint: No chief complaint on file.  History of Present Illness: I had the pleasure of seeing Brenda Booth for initial evaluation at the [facility] on 10/30/2023. She is a 48 y.o. female, who is referred here by Meribeth Derald SAILOR, NP for the evaluation of ***.  Discussed the use of AI scribe software for clinical note transcription with the patient, who gave verbal consent to proceed.  History of Present Illness             09/11/2023 ER visit: Brenda Booth is a 48 y.o. female with no medical history of the patient presents to the ED for evaluation of allergic reaction.  States that she was mowing her grass earlier today when she was bit by multiple ants on her ankles.  States that her ankles became very itchy and she became very hot.  Reports she stopped mowing the grass at this time and went inside for a shower.  States in the shower she continued to sweat so she decided to take a cold shower.  States she got out of the shower and had some vague itchiness throughout her entire body.  Went downstairs to eat sea moss and then began to have facial swelling.  She states that she then took her son to work and was on her way to [facility] for Benadryl  when she noticed that her face and tongue were swelling and she went to [place] walk-in clinic for evaluation.  At [facility] walk-in clinic, she was given epinephrine  and Benadryl  and then sent via EMS to the ED.  She states that this is the second time that she ate sea moss.  She reports when she initially ate it she had no reaction.  She states that she is taking this for health.  48 year old female presents for evaluation of suspected allergic reaction.  On examination, the patient does have angioedema to her bottom  and top lip.  Her uvula is midline, she is handling secretions appropriately, she is not drooling, there is no change in phonation.  She has slight facial swelling.  She has no evidence of hives.  She is tachycardic, afebrile.  Her lung sounds are clear bilaterally and she is not hypoxic.  She was seen at urgent care and given Benadryl  and epinephrine  and then EMS was called for transport.  Patient arrives stating that she has had some resolution of swelling.  She denies history of anaphylaxis or being seen by allergist in the past.   Patient given famotidine , Benadryl , steroids and fluid here.  On reassessment, after being observed for 3 hours, she reports that her symptoms of almost entirely resolved.  She is still slightly tachycardic but reports that she feels nervous and this is most likely attributing to this, she also had epinephrine .  She denies any chest pain or shortness of breath.  She reports feeling back to her baseline.  She is requesting discharge.  I will discharge patient home with EpiPen , steroid burst as well as referral to allergy  center.  Unsure as to what caused her allergic reaction today.  Have requested that she stop consuming sea moss. She was advised to follow-up in the meantime with allergy  center.  Encouraged to return to the ED with any new or worsening symptoms.  She  is stable to discharge home.  Assessment and Plan: [name] is a 48 y.o. female with: ***  Assessment and Plan               No follow-ups on file.  No orders of the defined types were placed in this encounter.  Lab Orders  No laboratory test(s) ordered today    Other allergy  screening: Asthma: {Blank single:19197::yes,no} Rhino conjunctivitis: {Blank single:19197::yes,no} Food allergy : {Blank single:19197::yes,no} Medication allergy : {Blank single:19197::yes,no} Hymenoptera allergy : {Blank single:19197::yes,no} Urticaria: {Blank single:19197::yes,no} Eczema:{Blank  single:19197::yes,no} History of recurrent infections suggestive of immunodeficency: {Blank single:19197::yes,no}  Diagnostics: Spirometry:  Tracings reviewed. Her effort: {Blank single:19197::Good reproducible efforts.,It was hard to get consistent efforts and there is a question as to whether this reflects a maximal maneuver.,Poor effort, data can not be interpreted.} FVC: ***L FEV1: ***L, ***% predicted FEV1/FVC ratio: ***% Interpretation: {Blank single:19197::Spirometry consistent with mild obstructive disease,Spirometry consistent with moderate obstructive disease,Spirometry consistent with severe obstructive disease,Spirometry consistent with possible restrictive disease,Spirometry consistent with mixed obstructive and restrictive disease,Spirometry uninterpretable due to technique,Spirometry consistent with normal pattern,No overt abnormalities noted given today's efforts}.  Please see scanned spirometry results for details.  Skin Testing: {Blank single:19197::Select foods,Environmental allergy  panel,Environmental allergy  panel and select foods,Food allergy  panel,None,Deferred due to recent antihistamines use}. *** Results discussed with patient/family.   Past Medical History: There are no active problems to display for this patient.  No past medical history on file. Past Surgical History: No past surgical history on file. Medication List:  Current Outpatient Medications  Medication Sig Dispense Refill   EPINEPHrine  0.3 mg/0.3 mL IJ SOAJ injection Inject 0.3 mg into the muscle as needed for anaphylaxis. 1 each 0   ibuprofen  (ADVIL ,MOTRIN ) 800 MG tablet Take 1 tablet (800 mg total) by mouth every 8 (eight) hours as needed for cramping. 21 tablet 0   predniSONE  (DELTASONE ) 20 MG tablet Take 2 tablets (40 mg total) by mouth daily. 6 tablet 0   No current facility-administered medications for this visit.   Allergies: No Known  Allergies Social History: Social History   Socioeconomic History   Marital status: Single    Spouse name: Not on file   Number of children: Not on file   Years of education: Not on file   Highest education level: Not on file  Occupational History   Not on file  Tobacco Use   Smoking status: Never   Smokeless tobacco: Never  Vaping Use   Vaping status: Never Used  Substance and Sexual Activity   Alcohol use: No   Drug use: No   Sexual activity: Not on file  Other Topics Concern   Not on file  Social History Narrative   Not on file   Social Drivers of Health   Financial Resource Strain: Not on file  Food Insecurity: Not on file  Transportation Needs: Not on file  Physical Activity: Not on file  Stress: Not on file  Social Connections: Unknown (03/25/2022)   Received from [facility]   Social Network    Social Network: Not on file   Lives in a ***. Smoking: *** Occupation: ***  Environmental HistorySurveyor, minerals in the house: Network engineer in the family room: {Blank single:19197::yes,no} Carpet in the bedroom: {Blank single:19197::yes,no} Heating: {Blank single:19197::electric,gas,heat pump} Cooling: {Blank single:19197::central,window,heat pump} Pet: {Blank single:19197::yes ***,no}  Family History: No family history on file. Problem  Relation Asthma                                   *** Eczema                                *** Food allergy                           *** Allergic rhino conjunctivitis     ***  Review of Systems  Constitutional:  Negative for appetite change, chills, fever and unexpected weight change.  HENT:  Negative for congestion and rhinorrhea.   Eyes:  Negative for itching.  Respiratory:  Negative for cough, chest tightness, shortness of breath and wheezing.   Cardiovascular:  Negative for chest pain.  Gastrointestinal:  Negative for abdominal pain.   Genitourinary:  Negative for difficulty urinating.  Skin:  Negative for rash.  Neurological:  Negative for headaches.    Objective: LMP 09/10/2023 (Approximate)  There is no height or weight on file to calculate BMI. Physical Exam Vitals and nursing note reviewed.  Constitutional:      Appearance: Normal appearance. She is well-developed.  HENT:     Head: Normocephalic and atraumatic.     Right Ear: Tympanic membrane and external ear normal.     Left Ear: Tympanic membrane and external ear normal.     Nose: Nose normal.     Mouth/Throat:     Mouth: Mucous membranes are moist.     Pharynx: Oropharynx is clear.  Eyes:     Conjunctiva/sclera: Conjunctivae normal.  Cardiovascular:     Rate and Rhythm: Normal rate and regular rhythm.     Heart sounds: Normal heart sounds. No murmur heard.    No friction rub. No gallop.  Pulmonary:     Effort: Pulmonary effort is normal.     Breath sounds: Normal breath sounds. No wheezing, rhonchi or rales.  Musculoskeletal:     Cervical back: Neck supple.  Skin:    General: Skin is warm.     Findings: No rash.  Neurological:     Mental Status: She is alert and oriented to person, place, and time.  Psychiatric:        Behavior: Behavior normal.    The plan was reviewed with the patient/family, and all questions/concerned were addressed.  It was my pleasure to see Natale today and participate in her care. Please feel free to contact me with any questions or concerns.  Sincerely,  Orlan Cramp, DO Allergy  & Immunology  [facility]  [place] office: [phone] [facility] office: [phone]

## 2023-10-30 ENCOUNTER — Ambulatory Visit (INDEPENDENT_AMBULATORY_CARE_PROVIDER_SITE_OTHER): Payer: Self-pay | Admitting: Allergy

## 2023-10-30 ENCOUNTER — Other Ambulatory Visit: Payer: Self-pay

## 2023-10-30 ENCOUNTER — Encounter: Payer: Self-pay | Admitting: Allergy

## 2023-10-30 VITALS — BP 110/78 | HR 85 | Temp 98.5°F | Resp 14 | Ht 64.25 in | Wt 189.3 lb

## 2023-10-30 DIAGNOSIS — Z91038 Other insect allergy status: Secondary | ICD-10-CM

## 2023-10-30 DIAGNOSIS — T7840XA Allergy, unspecified, initial encounter: Secondary | ICD-10-CM | POA: Diagnosis not present

## 2023-10-30 DIAGNOSIS — T7840XD Allergy, unspecified, subsequent encounter: Secondary | ICD-10-CM

## 2023-10-30 NOTE — Patient Instructions (Addendum)
 Allergic reactions Continue to avoid fire  ant bites. Based on clinical history - suspicious for fire  ant allergy  and less likely to sea moss. Hold off the sea moss until I get the bloodwork results.  I have prescribed epinephrine  injectable device and demonstrated proper use. For mild symptoms you can take over the counter antihistamines (zyrtec 10mg  to 20mg ) and monitor symptoms closely.  If symptoms worsen or if you have severe symptoms including breathing issues, throat closure, significant swelling, whole body hives, severe diarrhea and vomiting, lightheadedness then use epinephrine  and seek immediate medical care afterwards. Emergency action plan given.  Get bloodwork If positive will recommend allergy  injections. If negative, will need to do skin testing next.  We are ordering labs, so please allow 1-2 weeks for the results to come back. With the newly implemented Cures Act, the labs might be visible to you at the same time that they become visible to me. However, I will not address the results until all of the results are back, so please be patient.  In the meantime, continue recommendations in your patient instructions, including avoidance measures (if applicable), until you hear from me.  Follow up in 6 months or sooner if needed.

## 2023-11-02 LAB — HYMENOPTERA VENOM ALLERGY II

## 2023-11-04 LAB — HYMENOPTERA VENOM ALLERGY II
Bumblebee: <0.1 kU/L
Hornet, White Face, IgE: <0.1 kU/L
Hornet, Yellow, IgE: <0.1 kU/L
I001-IgE Honeybee: <0.1 kU/L
I003-IgE Yellow Jacket: 0.11 kU/L — AB
I004-IgE Paper Wasp: <0.1 kU/L
I208-IgE Api m 1: <0.1 kU/L
I209-IgE Ves v 5: 0.21 kU/L — AB
I210-IgE Pol d 5: 0.1 kU/L — AB
I211-IgE Ves v 1: <0.1 kU/L
I214-IgE Api m 2: <0.1 kU/L
I215-IgE Api m 3: <0.1 kU/L
I216-IgE Api m 5: <0.1 kU/L
I217-IgE Api m 10: <0.1 kU/L
Tryptase: 4.2 ug/L (ref 2.2–13.2)

## 2023-11-04 LAB — ALLERGEN FIRE ANT: I070-IgE Fire Ant (Invicta): 12.2 kU/L — AB

## 2023-11-04 LAB — ALLERGEN COMPONENT COMMENTS

## 2023-11-07 ENCOUNTER — Ambulatory Visit: Payer: Self-pay | Admitting: Allergy

## 2023-11-17 NOTE — Telephone Encounter (Signed)
 Pt called and I reviewed lab results. Mailed a copy of results as requested, along with venom shot info and CPT codes.

## 2024-02-09 DIAGNOSIS — H5213 Myopia, bilateral: Secondary | ICD-10-CM | POA: Diagnosis not present

## 2024-04-09 ENCOUNTER — Other Ambulatory Visit: Payer: Self-pay | Admitting: Obstetrics and Gynecology

## 2024-04-09 DIAGNOSIS — Z309 Encounter for contraceptive management, unspecified: Secondary | ICD-10-CM

## 2024-04-11 ENCOUNTER — Other Ambulatory Visit: Payer: Self-pay | Admitting: Obstetrics and Gynecology

## 2024-04-11 DIAGNOSIS — Z1231 Encounter for screening mammogram for malignant neoplasm of breast: Secondary | ICD-10-CM

## 2024-04-19 ENCOUNTER — Ambulatory Visit: Admitting: Sports Medicine

## 2024-04-29 ENCOUNTER — Ambulatory Visit: Admitting: Allergy

## 2024-05-15 ENCOUNTER — Ambulatory Visit

## 2024-06-12 ENCOUNTER — Ambulatory Visit: Admitting: Nurse Practitioner

## 2024-10-24 ENCOUNTER — Ambulatory Visit: Payer: Self-pay | Admitting: Nurse Practitioner
